# Patient Record
Sex: Female | Born: 2000 | Race: White | Hispanic: No | Marital: Single | Smoking: Never smoker
Health system: Southern US, Community
[De-identification: ages and names within clinical notes are randomized; demographics above are authoritative.]

## PROBLEM LIST (undated history)

## (undated) DIAGNOSIS — N939 Abnormal uterine and vaginal bleeding, unspecified: Secondary | ICD-10-CM

## (undated) DIAGNOSIS — R29898 Other symptoms and signs involving the musculoskeletal system: Secondary | ICD-10-CM

## (undated) DIAGNOSIS — H0012 Chalazion right lower eyelid: Secondary | ICD-10-CM

## (undated) DIAGNOSIS — K929 Disease of digestive system, unspecified: Secondary | ICD-10-CM

## (undated) DIAGNOSIS — N946 Dysmenorrhea, unspecified: Secondary | ICD-10-CM

## (undated) HISTORY — DX: Abnormal uterine and vaginal bleeding, unspecified: N93.9

## (undated) HISTORY — DX: Disease of digestive system, unspecified: K92.9

## (undated) HISTORY — PX: OTHER SURGICAL HISTORY: SHX169

## (undated) HISTORY — DX: Dysmenorrhea, unspecified: N94.6

---
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
## 2018-03-19 ENCOUNTER — Encounter: Payer: Self-pay | Admitting: Certified Nurse Midwife

## 2018-03-19 ENCOUNTER — Ambulatory Visit (INDEPENDENT_AMBULATORY_CARE_PROVIDER_SITE_OTHER): Payer: BLUE CROSS/BLUE SHIELD | Admitting: Certified Nurse Midwife

## 2018-03-19 VITALS — BP 104/64 | HR 68 | Resp 16 | Ht 66.25 in | Wt 159.0 lb

## 2018-03-19 DIAGNOSIS — Z3009 Encounter for other general counseling and advice on contraception: Secondary | ICD-10-CM

## 2018-03-19 NOTE — Progress Notes (Signed)
  Subjective:     Patient ID: Paige Browning, female   DOB: 19-Jan-2001, 19 y.o.   MRN: [id number]  17 yo white gopo female here to discuss contraception options. Patient currently on OCP which she is not using consistently. Having irregular periods and not happy with combination.She is college with unpredictable schedule and will be attending summer program also. Patient is sexually active and knows this not good contraception use. Here to discuss options. Mother personally has IUD and discussed with her this option. Patient is leaning toward this. No other health concerns today.    Review of Systems  Constitutional: Negative for activity change.  Genitourinary: Negative for vaginal bleeding, vaginal discharge and vaginal pain.       Irregular periods related to OCP use.  Pertinent history related to HPI reviewed.     Objective:   Physical Exam Not done Consult only    Assessment:     Contraception change desired. Inconsistent OCP use Contraceptive options    Plan:     Agree with patient inconsistent use of OCP not recommended for cycle use or contraception. Contraceptive options of Nuvaring,Nexplanon, Depo Provera, Kyleena IUD. Risks/benefits/warning signs/insertion/removal and bleeding expectations. Patient feels Rutha Bouchard IUD best choice for her. Discussed will need to use consistent contraception for insertion with condoms. Will need to be inserted on day 1-5 of period. Needs STD screening prior to insertion due to concerns with STD with IUD.  Discussed expectations with insertion and shown IUD. Given insurance information sheet to check on. Patient will call when she is menses and if she desires to go forward with insertion. Printed information on IUD given. Questions addressed.   Rv prn as above   26 minutes spent in consultation regarding contraception

## 2018-03-26 ENCOUNTER — Other Ambulatory Visit: Payer: Self-pay

## 2018-03-26 ENCOUNTER — Telehealth: Payer: Self-pay | Admitting: Certified Nurse Midwife

## 2018-03-26 ENCOUNTER — Telehealth: Payer: Self-pay

## 2018-03-26 DIAGNOSIS — Z308 Encounter for other contraceptive management: Secondary | ICD-10-CM

## 2018-03-26 DIAGNOSIS — IMO0001 Reserved for inherently not codable concepts without codable children: Secondary | ICD-10-CM

## 2018-03-26 NOTE — Telephone Encounter (Signed)
Spoke with patient. Patient started her menses today and would like to schedule nexplanon insertion. Appointment scheduled for 03/27/2018 at 11 am with Leota Sauers CNM. Patient is agreeable to date and time.  Routing to provider for final review. Patient agreeable to disposition. Will close encounter.

## 2018-03-26 NOTE — Telephone Encounter (Signed)
Patient calling to schedule nexplanon implantation. Started her cycle today.

## 2018-03-26 NOTE — Telephone Encounter (Signed)
Opened in error

## 2018-03-27 ENCOUNTER — Other Ambulatory Visit: Payer: Self-pay

## 2018-03-27 ENCOUNTER — Ambulatory Visit (INDEPENDENT_AMBULATORY_CARE_PROVIDER_SITE_OTHER): Payer: BLUE CROSS/BLUE SHIELD | Admitting: Certified Nurse Midwife

## 2018-03-27 ENCOUNTER — Encounter: Payer: Self-pay | Admitting: Certified Nurse Midwife

## 2018-03-27 VITALS — BP 100/60 | HR 64 | Resp 16 | Ht 66.25 in | Wt 159.0 lb

## 2018-03-27 DIAGNOSIS — Z30017 Encounter for initial prescription of implantable subdermal contraceptive: Secondary | ICD-10-CM | POA: Diagnosis not present

## 2018-03-27 DIAGNOSIS — Z01812 Encounter for preprocedural laboratory examination: Secondary | ICD-10-CM | POA: Diagnosis not present

## 2018-03-27 DIAGNOSIS — Z308 Encounter for other contraceptive management: Secondary | ICD-10-CM

## 2018-03-27 LAB — POCT URINE PREGNANCY: Preg Test, Ur: NEGATIVE

## 2018-03-27 NOTE — Patient Instructions (Signed)
Nexplanon Instructions After Insertion  Keep bandage clean and dry for 24 hours  May use ice/Tylenol/Ibuprofen for soreness or pain  If you develop fever, drainage or increased warmth from incision site-contact office immediately   

## 2018-03-27 NOTE — Progress Notes (Signed)
17 y.o. G0P0000 Caucasian Single female presents for Nexplanon insertion. Options for contraception have been reviewed including risks and benefits.  Pt feels this is best option for her. Aware of bleeding profile expectations of no period, spotting or regular period. Questions addressed.  LMP:  03/27/18  Current contraception:  oral contraceptives   After all questions were answered, consent was obtained.    Past Medical History:  Diagnosis Date  . Abnormal uterine bleeding   . Chalazion of right lower eyelid 11/2013  . Digestive problems    Slow Digestion - Persistent nausea   . Dysmenorrhea   . Jaw clicking     Past Surgical History:  Procedure Laterality Date  . CHALAZION EXCISION Right 12/15/2013   Procedure: EXCISION CHALAZION;  Surgeon: Cori Razor, MD;  [facility];  Service: Ophthalmology;  Laterality: Right;  . WISDOM TOOTH EXTRACTION Bilateral 05/23/16    Current Outpatient Medications on File Prior to Visit  Medication Sig Dispense Refill  . norethindrone-ethinyl estradiol (JUNEL FE,GILDESS FE,LOESTRIN FE) 1-20 MG-MCG tablet Take 1 tablet by mouth daily. 1 Package 12  . EPINEPHrine (EPIPEN 2-PAK) 0.3 mg/0.3 mL IJ SOAJ injection as needed.     No current facility-administered medications on file prior to visit.    Allergies  Allergen Reactions  . Cashew Nut Oil Anaphylaxis  . Penicillins Hives    Vitals:   03/27/18 1057  BP: 100/60  Pulse: 64  Resp: 16   Physical Exam  Constitutional: She is oriented to person, place, and time. She appears well-developed and well-nourished.  Neurological: She is alert and oriented to person, place, and time.  Skin: Skin is warm and dry.  Psychiatric: She has a normal mood and affect. Her behavior is normal. Judgment and thought content normal.    Procedure: Patient placed supine on exam table with her left arm   flexed at the elbow and externally rotated.  The insertion site was identified on the inner  side of upper arm.  Two marks were made 10cm above the medial epicondyle of the humerus.  The first mark for insertion and the second mark 4cm from the first. The insertion sited was cleansed with betadine solution. 3 ml of 1% Lidocaine was injected along the track of the insertion site. Lot:  [id number].  Exp: 1/23    The Nexplanon, Lot [id number], under sterile conditions, was inserted in left arm. The implant was palpated in the arm after insertion. A small adhesive bandage placed over insertion site. The patient palpated the device for monitoring of the device.  No active bleeding was noted.  A pressure bandage was place over the site.  Pt tolerated procedure well.  Assessment: Nexplanon insertion for contraceptive needs  Plan:  Pt knows to call back with any questions or concerns.  She is aware of signs/symptoms to watch for over the next few weeks. Pt aware Nexplanon should not be removed any later than 03/27/2021. Questions addressed.  Rv prn and one month .

## 2018-05-08 ENCOUNTER — Ambulatory Visit: Payer: BLUE CROSS/BLUE SHIELD | Admitting: Certified Nurse Midwife

## 2018-05-15 ENCOUNTER — Ambulatory Visit: Payer: BLUE CROSS/BLUE SHIELD | Admitting: Certified Nurse Midwife

## 2018-05-21 ENCOUNTER — Ambulatory Visit (INDEPENDENT_AMBULATORY_CARE_PROVIDER_SITE_OTHER): Payer: BLUE CROSS/BLUE SHIELD | Admitting: Certified Nurse Midwife

## 2018-05-21 ENCOUNTER — Encounter: Payer: Self-pay | Admitting: Certified Nurse Midwife

## 2018-05-21 ENCOUNTER — Other Ambulatory Visit: Payer: Self-pay

## 2018-05-21 VITALS — BP 102/62 | HR 64 | Resp 16 | Ht 66.25 in | Wt 161.0 lb

## 2018-05-21 DIAGNOSIS — Z3046 Encounter for surveillance of implantable subdermal contraceptive: Secondary | ICD-10-CM | POA: Diagnosis not present

## 2018-05-21 NOTE — Patient Instructions (Signed)
Etonogestrel implant What is this medicine? ETONOGESTREL (et oh noe JES trel) is a contraceptive (birth control) device. It is used to prevent pregnancy. It can be used for up to 3 years. This medicine may be used for other purposes; ask your health care provider or pharmacist if you have questions. COMMON BRAND NAME(S): Implanon, Nexplanon What should I tell my health care provider before I take this medicine? They need to know if you have any of these conditions: -abnormal vaginal bleeding -blood vessel disease or blood clots -cancer of the breast, cervix, or liver -depression -diabetes -gallbladder disease -headaches -heart disease or recent heart attack -high blood pressure -high cholesterol -kidney disease -liver disease -renal disease -seizures -tobacco smoker -an unusual or allergic reaction to etonogestrel, other hormones, anesthetics or antiseptics, medicines, foods, dyes, or preservatives -pregnant or trying to get pregnant -breast-feeding How should I use this medicine? This device is inserted just under the skin on the inner side of your upper arm by a health care professional. Talk to your pediatrician regarding the use of this medicine in children. Special care may be needed. Overdosage: If you think you have taken too much of this medicine contact a poison control center or emergency room at once. NOTE: This medicine is only for you. Do not share this medicine with others. What if I miss a dose? This does not apply. What may interact with this medicine? Do not take this medicine with any of the following medications: -amprenavir -bosentan -fosamprenavir This medicine may also interact with the following medications: -barbiturate medicines for inducing sleep or treating seizures -certain medicines for fungal infections like ketoconazole and itraconazole -grapefruit juice -griseofulvin -medicines to treat seizures like carbamazepine, felbamate, oxcarbazepine,  phenytoin, topiramate -modafinil -phenylbutazone -rifampin -rufinamide -some medicines to treat HIV infection like atazanavir, indinavir, lopinavir, nelfinavir, tipranavir, ritonavir -St. John's wort This list may not describe all possible interactions. Give your health care provider a list of all the medicines, herbs, non-prescription drugs, or dietary supplements you use. Also tell them if you smoke, drink alcohol, or use illegal drugs. Some items may interact with your medicine. What should I watch for while using this medicine? This product does not protect you against HIV infection (AIDS) or other sexually transmitted diseases. You should be able to feel the implant by pressing your fingertips over the skin where it was inserted. Contact your doctor if you cannot feel the implant, and use a non-hormonal birth control method (such as condoms) until your doctor confirms that the implant is in place. If you feel that the implant may have broken or become bent while in your arm, contact your healthcare provider. What side effects may I notice from receiving this medicine? Side effects that you should report to your doctor or health care professional as soon as possible: -allergic reactions like skin rash, itching or hives, swelling of the face, lips, or tongue -breast lumps -changes in emotions or moods -depressed mood -heavy or prolonged menstrual bleeding -pain, irritation, swelling, or bruising at the insertion site -scar at site of insertion -signs of infection at the insertion site such as fever, and skin redness, pain or discharge -signs of pregnancy -signs and symptoms of a blood clot such as breathing problems; changes in vision; chest pain; severe, sudden headache; pain, swelling, warmth in the leg; trouble speaking; sudden numbness or weakness of the face, arm or leg -signs and symptoms of liver injury like dark yellow or Ohlsen urine; general ill feeling or flu-like symptoms;  light-colored   stools; loss of appetite; nausea; right upper belly pain; unusually weak or tired; yellowing of the eyes or skin -unusual vaginal bleeding, discharge -signs and symptoms of a stroke like changes in vision; confusion; trouble speaking or understanding; severe headaches; sudden numbness or weakness of the face, arm or leg; trouble walking; dizziness; loss of balance or coordination Side effects that usually do not require medical attention (report to your doctor or health care professional if they continue or are bothersome): -acne -back pain -breast pain -changes in weight -dizziness -general ill feeling or flu-like symptoms -headache -irregular menstrual bleeding -nausea -sore throat -vaginal irritation or inflammation This list may not describe all possible side effects. Call your doctor for medical advice about side effects. You may report side effects to FDA at 1-800-FDA-1088. Where should I keep my medicine? This drug is given in a hospital or clinic and will not be stored at home. NOTE: This sheet is a summary. It may not cover all possible information. If you have questions about this medicine, talk to your doctor, pharmacist, or health care provider.  2018 Elsevier/Gold Standard (2016-05-18 11:19:22)  

## 2018-05-21 NOTE — Progress Notes (Signed)
17 y.o. Single Caucasian G0P0000 here for evaluation of nexplanon initiated on Mar 27, 2018 for contraception. Patient had spotting for 2 weeks after insertion and no other bleeding. Minimal bruising and soreness after insertion. Has been able to feel implant. No tenderness or soreness now. Denies any side effects. Happy with choice.  Denies headaches, nausea, DVT warning signs or symptoms, or other changes.   Keeping menses calendar. Will be going back to college in August and has aex here prior to leaving. No other health issues today.  O: Healthy female, WD WN Affect: normal orientation X 3  Nexplanon palpated intact in left arm, insertion scar healed well   A: Normal  Nexplanon follow up after insertion   P: Discussed normal expectations for bleeding profile with Nexplanon use. Discussed importance of feeling of Nexplanon to make sure intact per recommendations. Questions addressed.   13 minutes spent with patient with >50% of time spent in face to face counseling.  RV prn, aex

## 2018-06-20 ENCOUNTER — Ambulatory Visit: Payer: BLUE CROSS/BLUE SHIELD | Admitting: Certified Nurse Midwife

## 2018-06-25 ENCOUNTER — Encounter: Payer: Self-pay | Admitting: Certified Nurse Midwife

## 2018-06-25 ENCOUNTER — Ambulatory Visit (INDEPENDENT_AMBULATORY_CARE_PROVIDER_SITE_OTHER): Payer: BLUE CROSS/BLUE SHIELD | Admitting: Certified Nurse Midwife

## 2018-06-25 ENCOUNTER — Other Ambulatory Visit: Payer: Self-pay

## 2018-06-25 VITALS — BP 110/64 | HR 68 | Resp 16 | Ht 66.25 in | Wt 161.0 lb

## 2018-06-25 DIAGNOSIS — Z01419 Encounter for gynecological examination (general) (routine) without abnormal findings: Secondary | ICD-10-CM

## 2018-06-25 DIAGNOSIS — Z3046 Encounter for surveillance of implantable subdermal contraceptive: Secondary | ICD-10-CM

## 2018-06-25 NOTE — Patient Instructions (Signed)
General topics  Next pap or exam is  due in 1 year Take a Women's multivitamin Take 1200 mg. of calcium daily - prefer dietary If any concerns in interim to call back  Breast Self-Awareness Practicing breast self-awareness may pick up problems early, prevent significant medical complications, and possibly save your life. By practicing breast self-awareness, you can become familiar with how your breasts look and feel and if your breasts are changing. This allows you to notice changes early. It can also offer you some reassurance that your breast health is good. One way to learn what is normal for your breasts and whether your breasts are changing is to do a breast self-exam. If you find a lump or something that was not present in the past, it is best to contact your caregiver right away. Other findings that should be evaluated by your caregiver include nipple discharge, especially if it is bloody; skin changes or reddening; areas where the skin seems to be pulled in (retracted); or new lumps and bumps. Breast pain is seldom associated with cancer (malignancy), but should also be evaluated by a caregiver. BREAST SELF-EXAM The best time to examine your breasts is 5 7 days after your menstrual period is over.  ExitCare Patient Information 2013 [employer].   Exercise to Stay Healthy Exercise helps you become and stay healthy. EXERCISE IDEAS AND TIPS Choose exercises that:  You enjoy.  Fit into your day. You do not need to exercise really hard to be healthy. You can do exercises at a slow or medium level and stay healthy. You can:  Stretch before and after working out.  Try yoga, Pilates, or tai chi.  Lift weights.  Walk fast, swim, jog, run, climb stairs, bicycle, dance, or rollerskate.  Take aerobic classes. Exercises that burn about 150 calories:  Running 1  miles in 15 minutes.  Playing volleyball for 45 to 60 minutes.  Washing and waxing a car for 45 to 60  minutes.  Playing touch football for 45 minutes.  Walking 1  miles in 35 minutes.  Pushing a stroller 1  miles in 30 minutes.  Playing basketball for 30 minutes.  Raking leaves for 30 minutes.  Bicycling 5 miles in 30 minutes.  Walking 2 miles in 30 minutes.  Dancing for 30 minutes.  Shoveling snow for 15 minutes.  Swimming laps for 20 minutes.  Walking up stairs for 15 minutes.  Bicycling 4 miles in 15 minutes.  Gardening for 30 to 45 minutes.  Jumping rope for 15 minutes.  Washing windows or floors for 45 to 60 minutes. Document Released: 12/02/2010 Document Revised: 01/22/2012 Document Reviewed: 12/02/2010 ExitCare Patient Information 2013 [employer].   Other topics ( that may be useful information):    Sexually Transmitted Disease Sexually transmitted disease (STD) refers to any infection that is passed from person to person during sexual activity. This may happen by way of saliva, semen, blood, vaginal mucus, or urine. Common STDs include:  Gonorrhea.  Chlamydia.  Syphilis.  HIV/AIDS.  Genital herpes.  Hepatitis B and C.  Trichomonas.  Human papillomavirus (HPV).  Pubic lice. CAUSES  An STD may be spread by bacteria, virus, or parasite. A person can get an STD by:  Sexual intercourse with an infected person.  Sharing sex toys with an infected person.  Sharing needles with an infected person.  Having intimate contact with the genitals, mouth, or rectal areas of an infected person. SYMPTOMS  Some people may not have any symptoms, but   they can still pass the infection to others. Different STDs have different symptoms. Symptoms include:  Painful or bloody urination.  Pain in the pelvis, abdomen, vagina, anus, throat, or eyes.  Skin rash, itching, irritation, growths, or sores (lesions). These usually occur in the genital or anal area.  Abnormal vaginal discharge.  Penile discharge in men.  Soft, flesh-colored skin growths in the  genital or anal area.  Fever.  Pain or bleeding during sexual intercourse.  Swollen glands in the groin area.  Yellow skin and eyes (jaundice). This is seen with hepatitis. DIAGNOSIS  To make a diagnosis, your caregiver may:  Take a medical history.  Perform a physical exam.  Take a specimen (culture) to be examined.  Examine a sample of discharge under a microscope.  Perform blood test TREATMENT   Chlamydia, gonorrhea, trichomonas, and syphilis can be cured with antibiotic medicine.  Genital herpes, hepatitis, and HIV can be treated, but not cured, with prescribed medicines. The medicines will lessen the symptoms.  Genital warts from HPV can be treated with medicine or by freezing, burning (electrocautery), or surgery. Warts may come back.  HPV is a virus and cannot be cured with medicine or surgery.However, abnormal areas may be followed very closely by your caregiver and may be removed from the cervix, vagina, or vulva through office procedures or surgery. If your diagnosis is confirmed, your recent sexual partners need treatment. This is true even if they are symptom-free or have a negative culture or evaluation. They should not have sex until their caregiver says it is okay. HOME CARE INSTRUCTIONS  All sexual partners should be informed, tested, and treated for all STDs.  Take your antibiotics as directed. Finish them even if you start to feel better.  Only take over-the-counter or prescription medicines for pain, discomfort, or fever as directed by your caregiver.  Rest.  Eat a balanced diet and drink enough fluids to keep your urine clear or pale yellow.  Do not have sex until treatment is completed and you have followed up with your caregiver. STDs should be checked after treatment.  Keep all follow-up appointments, Pap tests, and blood tests as directed by your caregiver.  Only use latex condoms and water-soluble lubricants during sexual activity. Do not use  petroleum jelly or oils.  Avoid alcohol and illegal drugs.  Get vaccinated for HPV and hepatitis. If you have not received these vaccines in the past, talk to your caregiver about whether one or both might be right for you.  Avoid risky sex practices that can break the skin. The only way to avoid getting an STD is to avoid all sexual activity.Latex condoms and dental dams (for oral sex) will help lessen the risk of getting an STD, but will not completely eliminate the risk. SEEK MEDICAL CARE IF:   You have a fever.  You have any new or worsening symptoms. Document Released: 01/20/2003 Document Revised: 01/22/2012 Document Reviewed: 01/27/2011 [facility] Patient Information 2013 [employer].    Domestic Abuse You are being battered or abused if someone close to you hits, pushes, or physically hurts you in any way. You also are being abused if you are forced into activities. You are being sexually abused if you are forced to have sexual contact of any kind. You are being emotionally abused if you are made to feel worthless or if you are constantly threatened. It is important to remember that help is available. No one has the right to abuse you. PREVENTION OF FURTHER  ABUSE  Learn the warning signs of danger. This varies with situations but may include: the use of alcohol, threats, isolation from friends and family, or forced sexual contact. Leave if you feel that violence is going to occur.  If you are attacked or beaten, report it to the police so the abuse is documented. You do not have to press charges. The police can protect you while you or the attackers are leaving. Get the officer's name and badge number and a copy of the report.  Find someone you can trust and tell them what is happening to you: your caregiver, a nurse, clergy member, close friend or family member. Feeling ashamed is natural, but remember that you have done nothing wrong. No one deserves abuse. Document Released:  10/27/2000 Document Revised: 01/22/2012 Document Reviewed: 01/05/2011 ExitCare Patient Information 2013 [employer].    How Much is Too Much Alcohol? Drinking too much alcohol can cause injury, accidents, and health problems. These types of problems can include:   Car crashes.  Falls.  Family fighting (domestic violence).  Drowning.  Fights.  Injuries.  Burns.  Damage to certain organs.  Having a baby with birth defects. ONE DRINK CAN BE TOO MUCH WHEN YOU ARE:  Working.  Pregnant or breastfeeding.  Taking medicines. Ask your doctor.  Driving or planning to drive. If you or someone you know has a drinking problem, get help from a doctor.  Document Released: 08/26/2009 Document Revised: 01/22/2012 Document Reviewed: 08/26/2009 ExitCare Patient Information 2013 [employer].   Smoking Hazards Smoking cigarettes is extremely bad for your health. Tobacco smoke has over 200 known poisons in it. There are over 60 chemicals in tobacco smoke that cause cancer. Some of the chemicals found in cigarette smoke include:   Cyanide.  Benzene.  Formaldehyde.  Methanol (wood alcohol).  Acetylene (fuel used in welding torches).  Ammonia. Cigarette smoke also contains the poisonous gases nitrogen oxide and carbon monoxide.  Cigarette smokers have an increased risk of many serious medical problems and Smoking causes approximately:  90% of all lung cancer deaths in men.  80% of all lung cancer deaths in women.  90% of deaths from chronic obstructive lung disease. Compared with nonsmokers, smoking increases the risk of:  Coronary heart disease by 2 to 4 times.  Stroke by 2 to 4 times.  Men developing lung cancer by 23 times.  Women developing lung cancer by 13 times.  Dying from chronic obstructive lung diseases by 12 times.  . Smoking is the most preventable cause of death and disease in our society.  WHY IS SMOKING ADDICTIVE?  Nicotine is the chemical  agent in tobacco that is capable of causing addiction or dependence.  When you smoke and inhale, nicotine is absorbed rapidly into the bloodstream through your lungs. Nicotine absorbed through the lungs is capable of creating a powerful addiction. Both inhaled and non-inhaled nicotine may be addictive.  Addiction studies of cigarettes and spit tobacco show that addiction to nicotine occurs mainly during the teen years, when young people begin using tobacco products. WHAT ARE THE BENEFITS OF QUITTING?  There are many health benefits to quitting smoking.   Likelihood of developing cancer and heart disease decreases. Health improvements are seen almost immediately.  Blood pressure, pulse rate, and breathing patterns start returning to normal soon after quitting. QUITTING SMOKING   American Lung Association - 1-800-LUNGUSA  American Cancer Society - 1-800-ACS-2345 Document Released: 12/07/2004 Document Revised: 01/22/2012 Document Reviewed: 08/11/2009 ExitCare Patient Information 2013 ExitCare[employer]   LL[employer].   Stress Management Stress is a state of physical or mental tension that often results from changes in your life or normal routine. Some common causes of stress are:  Death of a loved one.  Injuries or severe illnesses.  Getting fired or changing jobs.  Moving into a new home. Other causes may be:  Sexual problems.  Business or financial losses.  Taking on a large debt.  Regular conflict with someone at home or at work.  Constant tiredness from lack of sleep. It is not just bad things that are stressful. It may be stressful to:  Win the lottery.  Get married.  Buy a new car. The amount of stress that can be easily tolerated varies from person to person. Changes generally cause stress, regardless of the types of change. Too much stress can affect your health. It may lead to physical or emotional problems. Too little stress (boredom) may also become stressful. SUGGESTIONS TO  REDUCE STRESS:  Talk things over with your family and friends. It often is helpful to share your concerns and worries. If you feel your problem is serious, you may want to get help from a professional counselor.  Consider your problems one at a time instead of lumping them all together. Trying to take care of everything at once may seem impossible. List all the things you need to do and then start with the most important one. Set a goal to accomplish 2 or 3 things each day. If you expect to do too many in a single day you will naturally fail, causing you to feel even more stressed.  Do not use alcohol or drugs to relieve stress. Although you may feel better for a short time, they do not remove the problems that caused the stress. They can also be habit forming.  Exercise regularly - at least 3 times per week. Physical exercise can help to relieve that "uptight" feeling and will relax you.  The shortest distance between despair and hope is often a good night's sleep.  Go to bed and get up on time allowing yourself time for appointments without being rushed.  Take a short "time-out" period from any stressful situation that occurs during the day. Close your eyes and take some deep breaths. Starting with the muscles in your face, tense them, hold it for a few seconds, then relax. Repeat this with the muscles in your neck, shoulders, hand, stomach, back and legs.  Take good care of yourself. Eat a balanced diet and get plenty of rest.  Schedule time for having fun. Take a break from your daily routine to relax. HOME CARE INSTRUCTIONS   Call if you feel overwhelmed by your problems and feel you can no longer manage them on your own.  Return immediately if you feel like hurting yourself or someone else. Document Released: 04/25/2001 Document Revised: 01/22/2012 Document Reviewed: 12/16/2007 ExitCare Patient Information 2013 [employer].   

## 2018-06-25 NOTE — Progress Notes (Signed)
17 y.o. G0P0000 Single  Caucasian Fe here for annual exam. Periods to scant none. Spotting only. Nexplanon working well for cycle control. No cramping noted. Patient very pleased with change in periods since insertion. Not sexually active ever. No health issues today. Will be starting college soon!  No LMP recorded. Patient has had an implant. inserted 03-27-18         Sexually active: No.  The current method of family planning is nexplanon.    Exercising: Yes.    light cardio Smoker:  no  Review of Systems  Constitutional: Negative.   HENT: Negative.   Eyes: Negative.   Respiratory: Negative.   Cardiovascular: Negative.   Gastrointestinal: Negative.   Genitourinary:       Menstrual cycle changes  Musculoskeletal: Negative.   Skin:       acne  Neurological: Negative.   Endo/Heme/Allergies: Negative.   Psychiatric/Behavioral: Negative.     Health Maintenance: Pap:  none History of Abnormal Pap: no MMG:  none Self Breast exams: occ Colonoscopy:  none BMD:   none TDaP: 2018 Shingles: no Pneumonia: no Hep C and HIV: not done Labs: if needed   reports that she has never smoked. She has never used smokeless tobacco. She reports that she does not drink alcohol or use drugs.  Past Medical History:  Diagnosis Date  . Abnormal uterine bleeding   . Chalazion of right lower eyelid 11/2013  . Digestive problems    Slow Digestion - Persistent nausea   . Dysmenorrhea   . Jaw clicking     Past Surgical History:  Procedure Laterality Date  . CHALAZION EXCISION Right 12/15/2013   Procedure: EXCISION CHALAZION;  Surgeon: Cori RazorMartha Green, MD;  Location: [facility];  Service: Ophthalmology;  Laterality: Right;  . WISDOM TOOTH EXTRACTION Bilateral 05/23/16    Current Outpatient Medications  Medication Sig Dispense Refill  . EPINEPHrine (EPIPEN 2-PAK) 0.3 mg/0.3 mL IJ SOAJ injection as needed.    . etonogestrel (NEXPLANON) 68 MG IMPL implant 1 each by Subdermal route once.     . Omega-3 Fatty Acids (FISH OIL PO) Take by mouth.     No current facility-administered medications for this visit.     Family History  Problem Relation Age of Onset  . Asthma Mother   . Hyperlipidemia Mother   . Hypertension Mother   . Diabetes Maternal Grandmother   . Hypertension Maternal Grandmother   . Cancer Maternal Grandmother        Endometrial   . Hypertension Maternal Grandfather   . Stroke Maternal Grandfather   . Squamous cell carcinoma Father   . Skin cancer Father        Basal   . Cancer Paternal Grandmother        Oral   . Melanoma Paternal Grandfather   . Cancer Paternal Grandfather        Thyroid and Lung  . Fibroids Maternal Aunt        Malignant  . Epilepsy Maternal Uncle     ROS:  Pertinent items are noted in HPI.  Otherwise, a comprehensive ROS was negative.  Exam:   BP 110/64   Pulse 68   Resp 16   Ht 5' 6.25" (1.683 m)   Wt 161 lb (73 kg)   BMI 25.79 kg/m  Height: 5' 6.25" (168.3 cm) Ht Readings from Last 3 Encounters:  06/25/18 5' 6.25" (1.683 m) (78 %, Z= 0.78)*  05/21/18 5' 6.25" (1.683 m) (78 %, Z= 0.78)*  03/27/18 5' 6.25" (1.683 m) (78 %, Z= 0.78)*   * Growth percentiles are based on CDC (Girls, 2-20 Years) data.    General appearance: alert, cooperative and appears stated age Head: Normocephalic, without obvious abnormality, atraumatic Neck: no adenopathy, supple, symmetrical, trachea midline and thyroid normal to inspection and palpation Lungs: clear to auscultation bilaterally Breasts: normal appearance, no masses or tenderness, No nipple retraction or dimpling, No nipple discharge or bleeding, No axillary or supraclavicular adenopathy Heart: regular rate and rhythm Abdomen: soft, non-tender; no masses,  no organomegaly Extremities: extremities normal, atraumatic, no cyanosis or edema, Nexplanon palpated intact in right arm Skin: Skin color, texture, turgor normal. No rashes or lesions Lymph nodes: Cervical, supraclavicular,  and axillary nodes normal. No abnormal inguinal nodes palpated Neurologic: Grossly normal   Pelvic: External genitalia:  no lesions, normal female              Urethra:  normal appearing urethra with no masses, tenderness or lesions              Bartholin's and Skene's: normal                 Vagina: normal appearing vagina with normal color and discharge, no lesions              Cervix: no cervical motion tenderness, no lesions and nulliparous appearance              Pap taken: No. Bimanual Exam:  Uterus:  normal size, contour, position, consistency, mobility, non-tender and anteflexed              Adnexa: normal adnexa and no mass, fullness, tenderness               Rectovaginal: Confirms               Anus:  normal appearance Chaperone present: yes  A:  Well Woman with normal exam  History of menorrhagia  Nexplanon working well for cycle management  P:   Reviewed health and wellness pertinent to exam  Warning signs with Nexplanon given and need to advise.    Stressed condom use if becomes sexually active.  Pap smear: no at age 17   counseled on breast self exam, STD prevention, HIV risk factors and prevention, adequate intake of calcium and vitamin D, diet and exercise  return annually or prn  An After Visit Summary was printed and given to the patient.

## 2018-08-08 DIAGNOSIS — Z23 Encounter for immunization: Secondary | ICD-10-CM | POA: Diagnosis not present

## 2018-08-08 DIAGNOSIS — W57XXXA Bitten or stung by nonvenomous insect and other nonvenomous arthropods, initial encounter: Secondary | ICD-10-CM | POA: Diagnosis not present

## 2018-08-08 DIAGNOSIS — S40862A Insect bite (nonvenomous) of left upper arm, initial encounter: Secondary | ICD-10-CM | POA: Diagnosis not present

## 2018-09-28 DIAGNOSIS — J029 Acute pharyngitis, unspecified: Secondary | ICD-10-CM | POA: Diagnosis not present

## 2018-09-28 DIAGNOSIS — R509 Fever, unspecified: Secondary | ICD-10-CM | POA: Diagnosis not present

## 2018-12-29 IMAGING — CR DG CHEST 2V
2 series · 2 of 2 positions shown · non-contrast
Comparison: February 19, 2017

CLINICAL DATA: Cough and pain

EXAM:
CHEST  2 VIEW

[w chest pa]
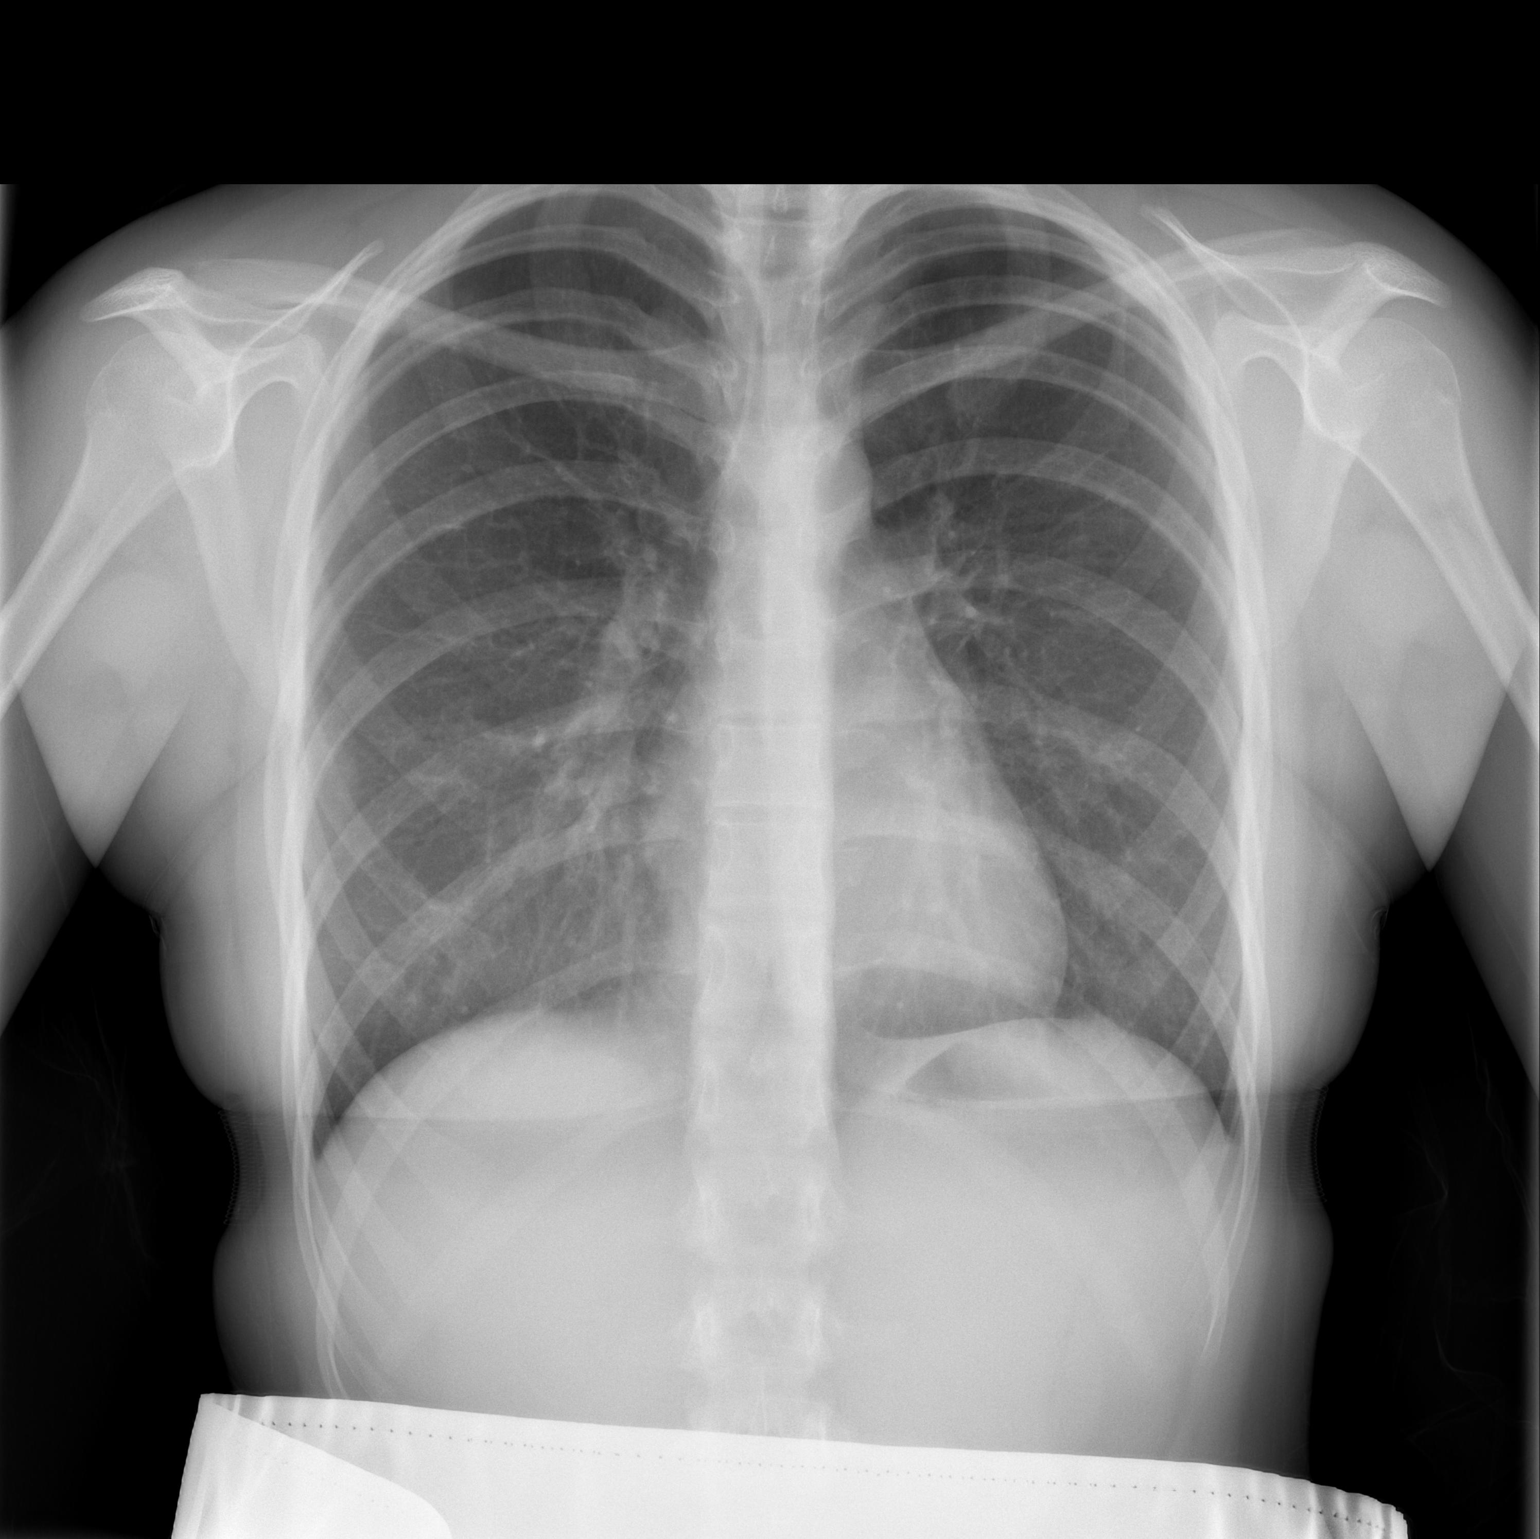

[w chest lat]
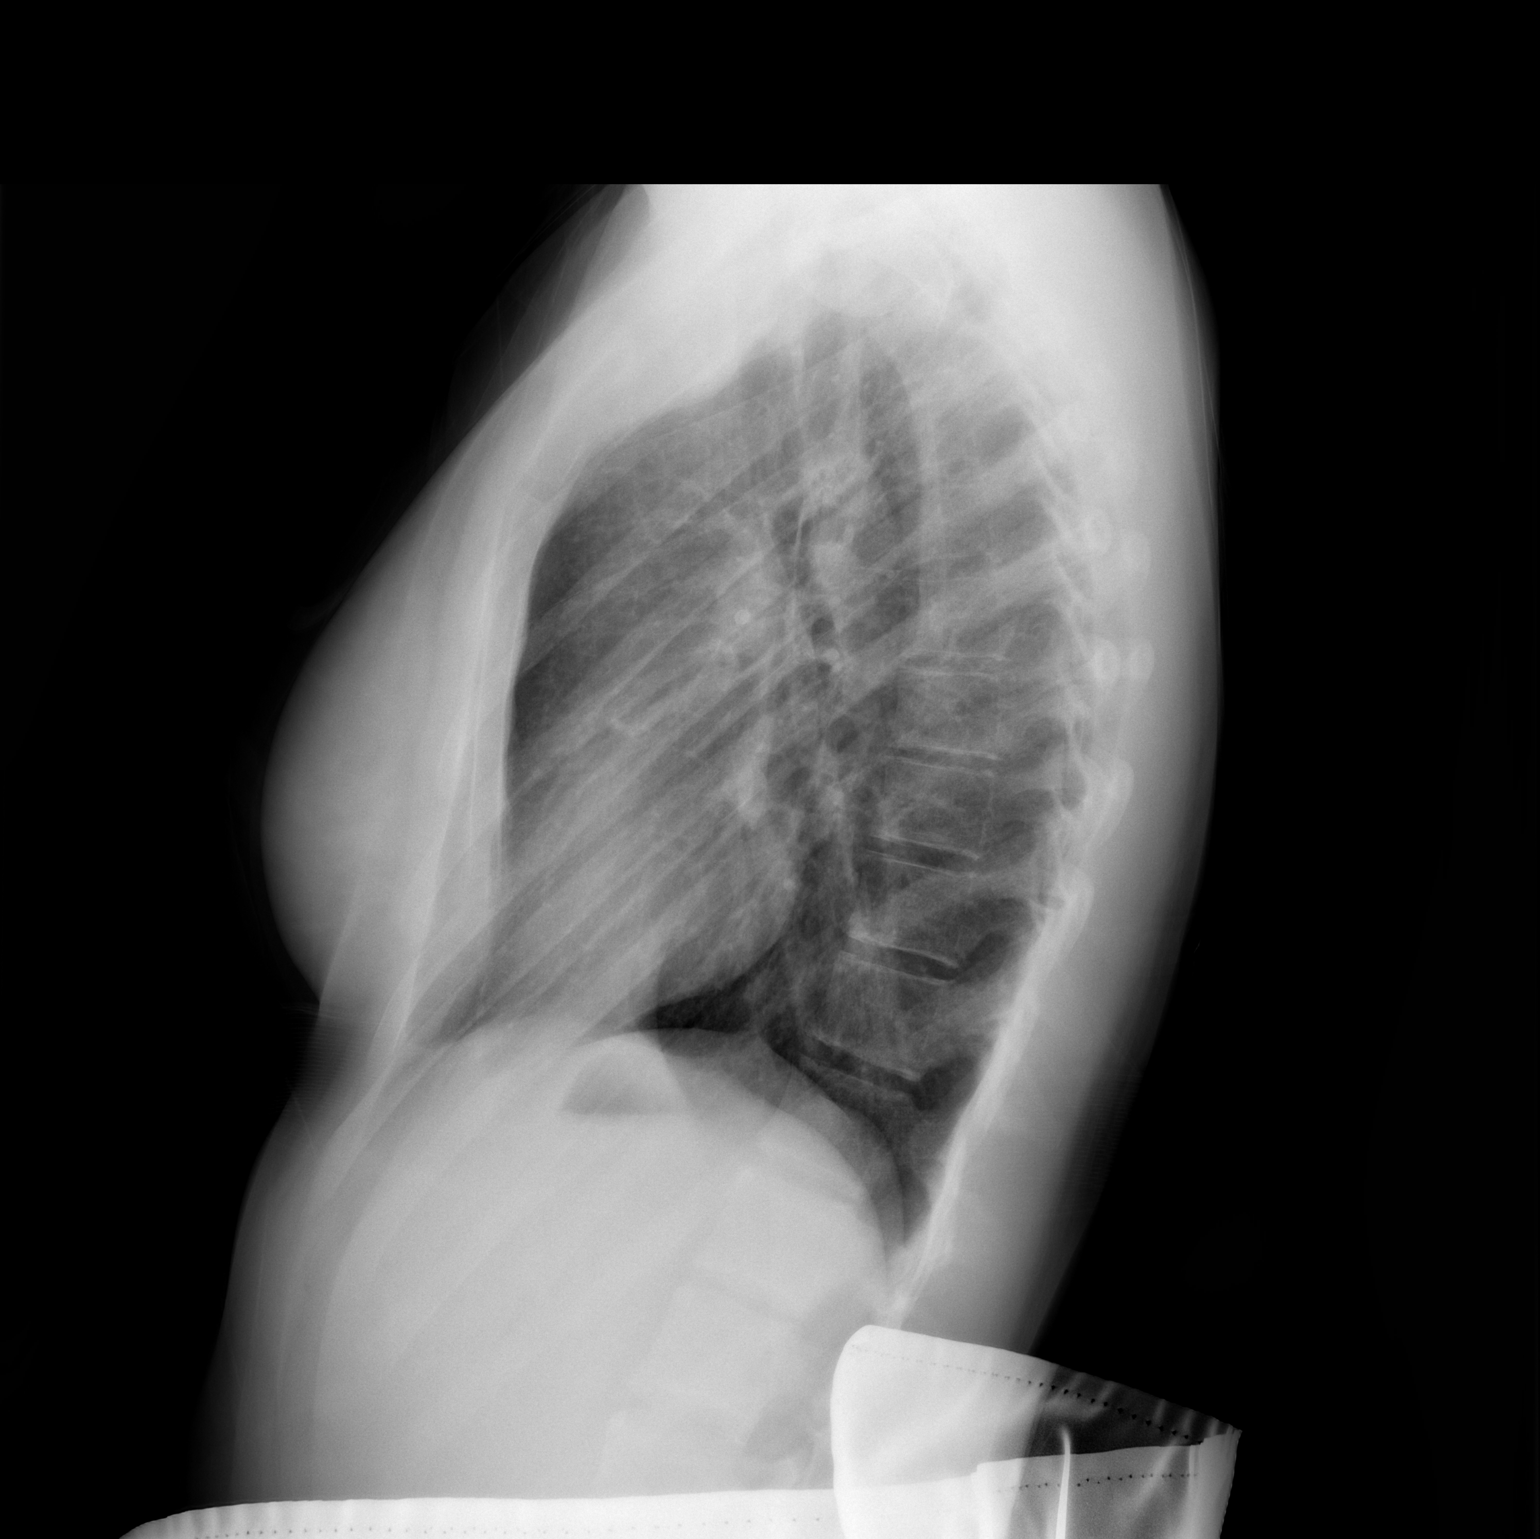

[2 of 2 positions shown; findings below may reference images not displayed]

FINDINGS: There is no edema or consolidation. Heart size and pulmonary
vascularity are normal. No adenopathy. No pneumothorax. No bone
lesions.
IMPRESSION: No abnormality noted.

## 2019-05-27 ENCOUNTER — Encounter: Payer: Self-pay | Admitting: Certified Nurse Midwife

## 2019-05-27 ENCOUNTER — Ambulatory Visit: Payer: 59 | Admitting: Certified Nurse Midwife

## 2019-05-27 ENCOUNTER — Other Ambulatory Visit: Payer: Self-pay

## 2019-05-27 VITALS — BP 120/80 | HR 70 | Temp 97.3°F | Resp 16 | Wt 161.0 lb

## 2019-05-27 DIAGNOSIS — Z3046 Encounter for surveillance of implantable subdermal contraceptive: Secondary | ICD-10-CM | POA: Diagnosis not present

## 2019-05-27 DIAGNOSIS — N921 Excessive and frequent menstruation with irregular cycle: Secondary | ICD-10-CM

## 2019-05-27 DIAGNOSIS — Z91018 Allergy to other foods: Secondary | ICD-10-CM | POA: Diagnosis not present

## 2019-05-27 MED ORDER — EPINEPHRINE 0.3 MG/0.3ML IJ SOAJ: 0.3000 mg | INTRAMUSCULAR | 1 refills | Status: AC | PRN

## 2019-05-27 NOTE — Progress Notes (Signed)
Review of Systems  Constitutional: Negative.   HENT: Negative.   Eyes: Negative.   Respiratory: Negative.   Cardiovascular: Negative.   Gastrointestinal: Negative.   Genitourinary: Negative.   Musculoskeletal: Negative.   Skin: Negative.   Neurological: Negative.   Endo/Heme/Allergies: Negative.   Psychiatric/Behavioral: Negative.     18 y.o. Single Caucasian G0P0000 here for evaluation of  Nexplanon initiated on May15/2019 for contraception. Periods stopped after insertion and started again 6 months later and continue monthly, until now this one has lasted about 2 months, and now starting and stopping with slightly heavier flow. Denies weakness or fatigue. No change in activity. Menses duration 5 days with light to heavy flow with the period that stopped yesterday. Patient can feel Nexplanon intact.. Denies , headaches, nausea, DVT warning signs or symptoms, not soaking more than a two tampons an hour or other changes.   Keeping menses calendar. Came in to make sure no other concerns. 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 Declines Valtrex use. 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 No other health issues today  O: Healthy female, WD WN Affect: normal orientation X 3  Physical Exam Exam conducted with a chaperone present.  Constitutional:      Appearance: Normal appearance.    HENT:     Head: Normocephalic.     Mouth/Throat:     Comments: Healing oral blister noted on left side of lip Neck:     Thyroid: No thyroid mass, thyromegaly or thyroid tenderness.   Cardiovascular:     Rate and Rhythm: Normal rate.  Pulmonary:     Effort: Pulmonary effort is normal.  Abdominal:     General: There is no distension.     Palpations: Abdomen is soft. There is no mass.     Tenderness: There is no abdominal tenderness. There is no guarding.  Genitourinary:    General: Normal vulva.     Exam position: Lithotomy position.     Labia:        Right:  No rash, tenderness or lesion.        Left: No rash, tenderness or lesion.      Vagina: No vaginal discharge, tenderness or bleeding.     Cervix: No cervical motion tenderness, discharge or cervical bleeding.     Uterus: Normal.      Adnexa: Right adnexa normal and left adnexa normal.       Right: No mass, tenderness or fullness.         Left: No mass, tenderness or fullness.    Lymphadenopathy:     Lower Body: No right inguinal adenopathy. No left inguinal adenopathy.  Skin:    General: Skin is warm and dry.  Neurological:     Mental Status: She is alert and oriented to person, place, and time.  Psychiatric:        Mood and Affect: Mood normal.        Behavior: Behavior normal.        Thought Content: Thought content normal.        Judgment: Judgment normal.       A: Normal pelvic exam Contraception Nexplanon intact,normal bleeding profile described R/O anemia R/O thyroid change Nut Allergy, needs Epi pen refill  P: Discussed normal pelvic exam finding and Nexplanon intact. Discussed expectation with Nexplanon and bleeding profile. Discussed warning signs with bleeding and need to advise if occurs.  Discussed will check to make sure no anemia with  bleeding. Discussed thyroid affect on menstrual cycle also and will check. Lab: TSH, CBC Rx. Epi Pen see order with instructions.  Patient has aex in one month and will assess bleeding again at that time unless change.  Rv prn, as above

## 2019-05-28 LAB — CBC
Hematocrit: 41.8 % (ref 34.0–46.6)
Hemoglobin: 13.8 g/dL (ref 11.1–15.9)
MCH: 28.8 pg (ref 26.6–33.0)
MCHC: 33 g/dL (ref 31.5–35.7)
MCV: 87 fL (ref 79–97)
Platelets: 265 10*3/uL (ref 150–450)
RBC: 4.79 x10E6/uL (ref 3.77–5.28)
RDW: 12.5 % (ref 11.7–15.4)
WBC: 7.4 10*3/uL (ref 3.4–10.8)

## 2019-05-28 LAB — TSH: TSH: 3.06 u[IU]/mL (ref 0.450–4.500)

## 2019-06-25 ENCOUNTER — Other Ambulatory Visit: Payer: Self-pay

## 2019-06-27 ENCOUNTER — Ambulatory Visit: Payer: BLUE CROSS/BLUE SHIELD | Admitting: Certified Nurse Midwife

## 2019-06-27 ENCOUNTER — Ambulatory Visit: Payer: 59 | Admitting: Certified Nurse Midwife

## 2019-06-27 ENCOUNTER — Other Ambulatory Visit: Payer: Self-pay

## 2019-06-27 ENCOUNTER — Encounter: Payer: Self-pay | Admitting: Certified Nurse Midwife

## 2019-06-27 VITALS — BP 116/76 | HR 64 | Temp 97.2°F | Resp 16 | Ht 66.25 in | Wt 159.0 lb

## 2019-06-27 DIAGNOSIS — Z01419 Encounter for gynecological examination (general) (routine) without abnormal findings: Secondary | ICD-10-CM | POA: Diagnosis not present

## 2019-06-27 NOTE — Patient Instructions (Signed)
General topics  Next pap or exam is  due in 1 year Take a Women's multivitamin Take 1200 mg. of calcium daily - prefer dietary If any concerns in interim to call back  Breast Self-Awareness Practicing breast self-awareness may pick up problems early, prevent significant medical complications, and possibly save your life. By practicing breast self-awareness, you can become familiar with how your breasts look and feel and if your breasts are changing. This allows you to notice changes early. It can also offer you some reassurance that your breast health is good. One way to learn what is normal for your breasts and whether your breasts are changing is to do a breast self-exam. If you find a lump or something that was not present in the past, it is best to contact your caregiver right away. Other findings that should be evaluated by your caregiver include nipple discharge, especially if it is bloody; skin changes or reddening; areas where the skin seems to be pulled in (retracted); or new lumps and bumps. Breast pain is seldom associated with cancer (malignancy), but should also be evaluated by a caregiver. BREAST SELF-EXAM The best time to examine your breasts is 5 7 days after your menstrual period is over.  ExitCare Patient Information 2013 [place].   Exercise to Stay Healthy Exercise helps you become and stay healthy. EXERCISE IDEAS AND TIPS Choose exercises that:  You enjoy.  Fit into your day. You do not need to exercise really hard to be healthy. You can do exercises at a slow or medium level and stay healthy. You can:  Stretch before and after working out.  Try yoga, Pilates, or tai chi.  Lift weights.  Walk fast, swim, jog, run, climb stairs, bicycle, dance, or rollerskate.  Take aerobic classes. Exercises that burn about 150 calories:  Running 1  miles in 15 minutes.  Playing volleyball for 45 to 60 minutes.  Washing and waxing a car for 45 to 60  minutes.  Playing touch football for 45 minutes.  Walking 1  miles in 35 minutes.  Pushing a stroller 1  miles in 30 minutes.  Playing basketball for 30 minutes.  Raking leaves for 30 minutes.  Bicycling 5 miles in 30 minutes.  Walking 2 miles in 30 minutes.  Dancing for 30 minutes.  Shoveling snow for 15 minutes.  Swimming laps for 20 minutes.  Walking up stairs for 15 minutes.  Bicycling 4 miles in 15 minutes.  Gardening for 30 to 45 minutes.  Jumping rope for 15 minutes.  Washing windows or floors for 45 to 60 minutes. Document Released: 12/02/2010 Document Revised: 01/22/2012 Document Reviewed: 12/02/2010 [facility] Patient Information 2013 [place].   Other topics ( that may be useful information):    Sexually Transmitted Disease Sexually transmitted disease (STD) refers to any infection that is passed from person to person during sexual activity. This may happen by way of saliva, semen, blood, vaginal mucus, or urine. Common STDs include:  Gonorrhea.  Chlamydia.  Syphilis.  HIV/AIDS.  Genital herpes.  Hepatitis B and C.  Trichomonas.  Human papillomavirus (HPV).  Pubic lice. CAUSES  An STD may be spread by bacteria, virus, or parasite. A person can get an STD by:  Sexual intercourse with an infected person.  Sharing sex toys with an infected person.  Sharing needles with an infected person.  Having intimate contact with the genitals, mouth, or rectal areas of an infected person. SYMPTOMS  Some people may  they can still pass the infection to others. Different STDs have different symptoms. Symptoms include: °· Painful or bloody urination. °· Pain in the pelvis, abdomen, vagina, anus, throat, or eyes. °· Skin rash, itching, irritation, growths, or sores (lesions). These usually occur in the genital or anal area. °· Abnormal vaginal discharge. °· Penile discharge in men. °· Soft, flesh-colored skin growths in the  genital or anal area. °· Fever. °· Pain or bleeding during sexual intercourse. °· Swollen glands in the groin area. °· Yellow skin and eyes (jaundice). This is seen with hepatitis. °DIAGNOSIS  °To make a diagnosis, your caregiver may: °· Take a medical history. °· Perform a physical exam. °· Take a specimen (culture) to be examined. °· Examine a sample of discharge under a microscope. °· Perform blood test °TREATMENT  °· Chlamydia, gonorrhea, trichomonas, and syphilis can be cured with antibiotic medicine. °· Genital herpes, hepatitis, and HIV can be treated, but not cured, with prescribed medicines. The medicines will lessen the symptoms. °· Genital warts from HPV can be treated with medicine or by freezing, burning (electrocautery), or surgery. Warts may come back. °· HPV is a virus and cannot be cured with medicine or surgery. However, abnormal areas may be followed very closely by your caregiver and may be removed from the cervix, vagina, or vulva through office procedures or surgery. °If your diagnosis is confirmed, your recent sexual partners need treatment. This is true even if they are symptom-free or have a negative culture or evaluation. They should not have sex until their caregiver says it is okay. °HOME CARE INSTRUCTIONS °· All sexual partners should be informed, tested, and treated for all STDs. °· Take your antibiotics as directed. Finish them even if you start to feel better. °· Only take over-the-counter or prescription medicines for pain, discomfort, or fever as directed by your caregiver. °· Rest. °· Eat a balanced diet and drink enough fluids to keep your urine clear or pale yellow. °· Do not have sex until treatment is completed and you have followed up with your caregiver. STDs should be checked after treatment. °· Keep all follow-up appointments, Pap tests, and blood tests as directed by your caregiver. °· Only use latex condoms and water-soluble lubricants during sexual activity. Do not use  petroleum jelly or oils. °· Avoid alcohol and illegal drugs. °· Get vaccinated for HPV and hepatitis. If you have not received these vaccines in the past, talk to your caregiver about whether one or both might be right for you. °· Avoid risky sex practices that can break the skin. °The only way to avoid getting an STD is to avoid all sexual activity. Latex condoms and dental dams (for oral sex) will help lessen the risk of getting an STD, but will not completely eliminate the risk. °SEEK MEDICAL CARE IF:  °· You have a fever. °· You have any new or worsening symptoms. °Document Released: 01/20/2003 Document Revised: 01/22/2012 Document Reviewed: 01/27/2011 °ExitCare® Patient Information ©2013 [employer]. ° ° ° °Domestic Abuse °You are being battered or abused if someone close to you hits, pushes, or physically hurts you in any way. You also are being abused if you are forced into activities. You are being sexually abused if you are forced to have sexual contact of any kind. You are being emotionally abused if you are made to feel worthless or if you are constantly threatened. It is important to remember that help is available. No one has the right to abuse you. °PREVENTION OF FURTHER   abuse you. PREVENTION OF FURTHER ABUSE  Learn the warning signs of danger. This varies with situations but may include: the use of alcohol, threats, isolation from friends and family, or forced sexual contact. Leave if you feel that violence is going to occur.  If you are attacked or beaten, report it to the police so the abuse is documented. You do not have to press charges. The police can protect you while you or the attackers are leaving. Get the officer's name and badge number and a copy of the report.  Find someone you can trust and tell them what is happening to you: your caregiver, a nurse, clergy member, close friend or family member. Feeling ashamed is natural, but remember that you have done nothing wrong. No one deserves abuse. Document Released:  10/27/2000 Document Revised: 01/22/2012 Document Reviewed: 01/05/2011 [facility] Patient Information 2013 [place].    How Much is Too Much Alcohol? Drinking too much alcohol can cause injury, accidents, and health problems. These types of problems can include:   Car crashes.  Falls.  Family fighting (domestic violence).  Drowning.  Fights.  Injuries.  Burns.  Damage to certain organs.  Having a baby with birth defects. ONE DRINK CAN BE TOO MUCH WHEN YOU ARE:  Working.  Pregnant or breastfeeding.  Taking medicines. Ask your doctor.  Driving or planning to drive. If you or someone you know has a drinking problem, get help from a doctor.  Document Released: 08/26/2009 Document Revised: 01/22/2012 Document Reviewed: 08/26/2009 [facility] Patient Information 2013 [place].   Smoking Hazards Smoking cigarettes is extremely bad for your health. Tobacco smoke has over 200 known poisons in it. There are over 60 chemicals in tobacco smoke that cause cancer. Some of the chemicals found in cigarette smoke include:   Cyanide.  Benzene.  Formaldehyde.  Methanol (wood alcohol).  Acetylene (fuel used in welding torches).  Ammonia. Cigarette smoke also contains the poisonous gases nitrogen oxide and carbon monoxide.  Cigarette smokers have an increased risk of many serious medical problems and Smoking causes approximately:  90% of all lung cancer deaths in men.  80% of all lung cancer deaths in women.  90% of deaths from chronic obstructive lung disease. Compared with nonsmokers, smoking increases the risk of:  Coronary heart disease by 2 to 4 times.  Stroke by 2 to 4 times.  Men developing lung cancer by 23 times.  Women developing lung cancer by 13 times.  Dying from chronic obstructive lung diseases by 12 times.  . Smoking is the most preventable cause of death and disease in our society.  WHY IS SMOKING ADDICTIVE?  Nicotine is the chemical  agent in tobacco that is capable of causing addiction or dependence.  When you smoke and inhale, nicotine is absorbed rapidly into the bloodstream through your lungs. Nicotine absorbed through the lungs is capable of creating a powerful addiction. Both inhaled and non-inhaled nicotine may be addictive.  Addiction studies of cigarettes and spit tobacco show that addiction to nicotine occurs mainly during the teen years, when young people begin using tobacco products. WHAT ARE THE BENEFITS OF QUITTING?  There are many health benefits to quitting smoking.   Likelihood of developing cancer and heart disease decreases. Health improvements are seen almost immediately.  Blood pressure, pulse rate, and breathing patterns start returning to normal soon after quitting. QUITTING SMOKING   American Lung Association - 1-800-LUNGUSA  American Cancer Society - 1-800-ACS-2345 Document Released: 12/07/2004 Document Revised: 01/22/2012 Document Reviewed: 08/11/2009  ExitCare Patient Information 2013 [place].   Stress Management Stress is a state of physical or mental tension that often results from changes in your life or normal routine. Some common causes of stress are:  Death of a loved one.  Injuries or severe illnesses.  Getting fired or changing jobs.  Moving into a new home. Other causes may be:  Sexual problems.  Business or financial losses.  Taking on a large debt.  Regular conflict with someone at home or at work.  Constant tiredness from lack of sleep. It is not just bad things that are stressful. It may be stressful to:  Win the lottery.  Get married.  Buy a new car. The amount of stress that can be easily tolerated varies from person to person. Changes generally cause stress, regardless of the types of change. Too much stress can affect your health. It may lead to physical or emotional problems. Too little stress (boredom) may also become stressful. SUGGESTIONS TO  REDUCE STRESS:  Talk things over with your family and friends. It often is helpful to share your concerns and worries. If you feel your problem is serious, you may want to get help from a professional counselor.  Consider your problems one at a time instead of lumping them all together. Trying to take care of everything at once may seem impossible. List all the things you need to do and then start with the most important one. Set a goal to accomplish 2 or 3 things each day. If you expect to do too many in a single day you will naturally fail, causing you to feel even more stressed.  Do not use alcohol or drugs to relieve stress. Although you may feel better for a short time, they do not remove the problems that caused the stress. They can also be habit forming.  Exercise regularly - at least 3 times per week. Physical exercise can help to relieve that "uptight" feeling and will relax you.  The shortest distance between despair and hope is often a good night's sleep.  Go to bed and get up on time allowing yourself time for appointments without being rushed.  Take a short "time-out" period from any stressful situation that occurs during the day. Close your eyes and take some deep breaths. Starting with the muscles in your face, tense them, hold it for a few seconds, then relax. Repeat this with the muscles in your neck, shoulders, hand, stomach, back and legs.  Take good care of yourself. Eat a balanced diet and get plenty of rest.  Schedule time for having fun. Take a break from your daily routine to relax. HOME CARE INSTRUCTIONS   Call if you feel overwhelmed by your problems and feel you can no longer manage them on your own.  Return immediately if you feel like hurting yourself or someone else. Document Released: 04/25/2001 Document Revised: 01/22/2012 Document Reviewed: 12/16/2007 [facility] Patient Information 2013 [place].

## 2019-06-27 NOTE — Progress Notes (Signed)
18 y.o. G0P0000 Single  Caucasian Fe here for annual exam. Nexplanon working well. Periods monthly now and aware this could change. Continue with keeping menses calendar and advise if concerns.. No other health issues. Leaving for college again today!   No LMP recorded. Patient has had an implant.          Sexually active: No. never sexually active The current method of family planning is nexplanon.    Exercising: Yes.    walking Smoker:  no  Review of Systems  Constitutional: Negative.   HENT: Negative.   Eyes: Negative.   Respiratory: Negative.   Cardiovascular: Negative.   Gastrointestinal: Negative.   Genitourinary: Negative.   Musculoskeletal: Negative.   Skin: Negative.   Neurological: Negative.   Endo/Heme/Allergies: Negative.   Psychiatric/Behavioral: Negative.     Health Maintenance: Pap:  none History of Abnormal Pap: no MMG:  none Self Breast exams: yes Colonoscopy:  none BMD:   none TDaP:  2018 Shingles: no Pneumonia: no Hep C and HIV: not done Labs: if  needed   reports that she has never smoked. She has never used smokeless tobacco. She reports that she does not drink alcohol or use drugs.  Past Medical History:  Diagnosis Date  . Abnormal uterine bleeding   . Chalazion of right lower eyelid 11/2013  . Digestive problems    Slow Digestion - Persistent nausea   . Dysmenorrhea   . Jaw clicking     Past Surgical History:  Procedure Laterality Date  . CHALAZION EXCISION Right 12/15/2013   Procedure: EXCISION CHALAZION;  Surgeon: Cori RazorMartha Green, MD;  Location: [facility];  Service: Ophthalmology;  Laterality: Right;  . nexplanon     inserted 03-27-18  . WISDOM TOOTH EXTRACTION Bilateral 05/23/16    Current Outpatient Medications  Medication Sig Dispense Refill  . cyclobenzaprine (FLEXERIL) 5 MG tablet TAKE 1 TABLET BY MOUTH AT NIGHT BEFORE bed FOR myofacial PAIN    . etonogestrel (NEXPLANON) 68 MG IMPL implant 1 each by Subdermal route  once.    . loratadine (CLARITIN) 10 MG tablet Take 10 mg by mouth daily.    . Omega-3 Fatty Acids (FISH OIL PO) Take by mouth.    . EPINEPHrine (EPIPEN 2-PAK) 0.3 mg/0.3 mL IJ SOAJ injection Inject 0.3 mLs (0.3 mg total) into the muscle as needed. (Patient not taking: Reported on 06/27/2019) 1 each 1   No current facility-administered medications for this visit.     Family History  Problem Relation Age of Onset  . Asthma Mother   . Hyperlipidemia Mother   . Hypertension Mother   . Diabetes Maternal Grandmother   . Hypertension Maternal Grandmother   . Cancer Maternal Grandmother        Endometrial   . Hypertension Maternal Grandfather   . Stroke Maternal Grandfather   . Squamous cell carcinoma Father   . Skin cancer Father        Basal   . Cancer Paternal Grandmother        Oral   . Melanoma Paternal Grandfather   . Cancer Paternal Grandfather        Thyroid and Lung  . Fibroids Maternal Aunt        Malignant  . Epilepsy Maternal Uncle     ROS:  Pertinent items are noted in HPI.  Otherwise, a comprehensive ROS was negative.  Exam:   BP 116/76   Pulse 64   Temp (!) 97.2 F (36.2 C) (Skin)   Resp  16   Ht 5' 6.25" (1.683 m)   Wt 159 lb (72.1 kg)   BMI 25.47 kg/m  Height: 5' 6.25" (168.3 cm) Ht Readings from Last 3 Encounters:  06/27/19 5' 6.25" (1.683 m) (78 %, Z= 0.76)*  06/25/18 5' 6.25" (1.683 m) (78 %, Z= 0.78)*  05/21/18 5' 6.25" (1.683 m) (78 %, Z= 0.78)*   * Growth percentiles are based on CDC (Girls, 2-20 Years) data.    General appearance: alert, cooperative and appears stated age Head: Normocephalic, without obvious abnormality, atraumatic Neck: no adenopathy, supple, symmetrical, trachea midline and thyroid normal to inspection and palpation Lungs: clear to auscultation bilaterally Breasts: normal appearance, no masses or tenderness, No nipple retraction or dimpling, No nipple discharge or bleeding, No axillary or supraclavicular adenopathy Heart:  regular rate and rhythm Abdomen: soft, non-tender; no masses,  no organomegaly Extremities: extremities normal, atraumatic, no cyanosis or edema, Nexplanon palpates intact Skin: Skin color, texture, turgor normal. No rashes or lesions Lymph nodes: Cervical, supraclavicular, and axillary nodes normal. No abnormal inguinal nodes palpated Neurologic: Grossly normal   Pelvic: External genitalia:  no lesions              Urethra:  normal appearing urethra with no masses, tenderness or lesions              Bartholin's and Skene's: normal                 Vagina: normal appearing vagina with normal color and discharge, no lesions              Cervix: no cervical motion tenderness, no lesions, nulliparous appearance and menses present              Pap taken: No. Bimanual Exam:  Uterus:  normal size, contour, position, consistency, mobility, non-tender and anteverted              Adnexa: normal adnexa and no mass, fullness, tenderness               Rectovaginal: Confirms               Anus:  normal appearance, no lesions  Chaperone present: yes  A:  Well Woman with normal exam  Contraception Nexplanon inserted 03/27/18  P:   Reviewed health and wellness pertinent to exam  Aware of bleeding profile with Nexplanon and will advise if concerns again or warning signs  Pap smear: no   counseled on breast self exam, STD prevention, HIV risk factors and prevention, feminine hygiene, adequate intake of calcium and vitamin D, diet and exercise  return annually or prn  An After Visit Summary was printed and given to the patient.

## 2020-01-26 ENCOUNTER — Encounter: Payer: Self-pay | Admitting: Certified Nurse Midwife

## 2020-01-28 ENCOUNTER — Encounter: Payer: Self-pay | Admitting: Certified Nurse Midwife

## 2020-03-11 DIAGNOSIS — R300 Dysuria: Secondary | ICD-10-CM | POA: Diagnosis not present

## 2020-03-11 DIAGNOSIS — Z113 Encounter for screening for infections with a predominantly sexual mode of transmission: Secondary | ICD-10-CM | POA: Diagnosis not present

## 2020-03-11 DIAGNOSIS — Z202 Contact with and (suspected) exposure to infections with a predominantly sexual mode of transmission: Secondary | ICD-10-CM | POA: Diagnosis not present
